# Patient Record
Sex: Male | Born: 1985 | Race: Black or African American | Hispanic: No | Marital: Married | Smoking: Never smoker
Health system: Southern US, Community
[De-identification: ages and names within clinical notes are randomized; demographics above are authoritative.]

---

## 2018-08-25 ENCOUNTER — Ambulatory Visit (HOSPITAL_COMMUNITY)
Admission: EM | Admit: 2018-08-25 | Discharge: 2018-08-25 | Disposition: A | Discharge status: Home / Self Care | Payer: Medicaid Other | Attending: Family Medicine | Admitting: Family Medicine

## 2018-08-25 ENCOUNTER — Encounter (HOSPITAL_COMMUNITY): Payer: Self-pay | Admitting: Family Medicine

## 2018-08-25 DIAGNOSIS — K130 Diseases of lips: Secondary | ICD-10-CM

## 2018-08-25 MED ORDER — DOXYCYCLINE HYCLATE 100 MG PO TABS: 100.0000 mg | ORAL_TABLET | Freq: Two times a day (BID) | ORAL | 0 refills | Status: AC

## 2018-08-25 NOTE — ED Triage Notes (Signed)
Pt presents with cold sore and rash on legs under knee.

## 2018-08-25 NOTE — Discharge Instructions (Addendum)
Please return on Monday night after 6:00 so that I can see how you are doing area

## 2018-08-25 NOTE — ED Provider Notes (Signed)
[facility]    CSN: [id number] Arrival date & time: 08/25/18  1722     History   Chief Complaint Chief Complaint  Patient presents with  . Rash  . Mouth Sore    HPI Joshua Lyons is a 32 y.o. male.   This is a 32 year old man who just returned from [place] and has a swollen upper lip that is become painful.  He is not sure about whether he is run a fever or not.  Patient is quite tired from the travel.     History reviewed. No pertinent past medical history.  There are no active problems to display for this patient.   History reviewed. No pertinent surgical history.     Home Medications    Prior to Admission medications   Medication Sig Start Date End Date Taking? Authorizing Provider  doxycycline (VIBRA-TABS) 100 MG tablet Take 1 tablet (100 mg total) by mouth 2 (two) times daily. 08/25/18   Elvina Sidle, MD    Family History History reviewed. No pertinent family history.  Social History Social History   Tobacco Use  . Smoking status: Not on file  Substance Use Topics  . Alcohol use: Not on file  . Drug use: Not on file     Allergies   Patient has no known allergies.   Review of Systems Review of Systems  Constitutional: Positive for fatigue.  HENT:       Swollen upper lip in the midline  All other systems reviewed and are negative.    Physical Exam Triage Vital Signs ED Triage Vitals  Enc Vitals Group     BP 08/25/18 1751 116/73     Pulse Rate 08/25/18 1751 67     Resp 08/25/18 1751 20     Temp 08/25/18 1751 98 F (36.7 C)     Temp Source 08/25/18 1751 Oral     SpO2 08/25/18 1751 97 %     Weight --      Height --      Head Circumference --      Peak Flow --      Pain Score 08/25/18 1752 4     Pain Loc --      Pain Edu? --      Excl. in GC? --    No data found.  Updated Vital Signs BP 116/73 (BP Location: Left Arm)   Pulse 67   Temp 98 F (36.7 C) (Oral)   Resp 20   SpO2 97%   Physical Exam    Constitutional: He is oriented to person, place, and time. He appears well-developed and well-nourished.  HENT:  Right Ear: External ear normal.  Left Ear: External ear normal.  Eyes: Pupils are equal, round, and reactive to light. Conjunctivae are normal.  Neck: Normal range of motion. Neck supple.  Pulmonary/Chest: Effort normal.  Musculoskeletal: Normal range of motion.  Lymphadenopathy:    He has no cervical adenopathy.  Neurological: He is alert and oriented to person, place, and time.  Skin: Skin is warm and dry.  Fluctuant swelling in the midline of the upper lip, sparing the vermilion border  Nursing note and vitals reviewed.      UC Treatments / Results  Labs (all labs ordered are listed, but only abnormal results are displayed) Labs Reviewed - No data to display  EKG None  Radiology No results found.  Procedures Procedures (including critical care time)  Medications Ordered in UC Medications - No data to display  Initial Impression / Assessment and Plan / UC Course  I have reviewed the triage vital signs and the nursing notes.  Pertinent labs & imaging results that were available during my care of the patient were reviewed by me and considered in my medical decision making (see chart for details).     Final Clinical Impressions(s) / UC Diagnoses   Final diagnoses:  Cellulitis, lip     Discharge Instructions     Please return on Monday night after 6:00 so that I can see how you are doing area    ED Prescriptions    Medication Sig Dispense Auth. Provider   doxycycline (VIBRA-TABS) 100 MG tablet Take 1 tablet (100 mg total) by mouth 2 (two) times daily. 20 tablet Elvina Sidle, MD     Controlled Substance Prescriptions [place] Controlled Substance Registry consulted? Not Applicable   Elvina Sidle, MD 08/25/18 1800

## 2018-09-19 ENCOUNTER — Ambulatory Visit (INDEPENDENT_AMBULATORY_CARE_PROVIDER_SITE_OTHER): Payer: Medicaid Other | Admitting: Family Medicine

## 2018-09-19 ENCOUNTER — Other Ambulatory Visit: Payer: Self-pay

## 2018-09-19 DIAGNOSIS — M222X2 Patellofemoral disorders, left knee: Secondary | ICD-10-CM

## 2018-09-19 DIAGNOSIS — Z0289 Encounter for other administrative examinations: Secondary | ICD-10-CM

## 2018-09-19 NOTE — Patient Instructions (Addendum)
Thank you for coming to see me today. It was a pleasure! Today we talked about:   Your knee pain. Do the knee exercises as discussed.   Please follow-up with me on 10/02/18 or sooner as needed.  If you have any questions or concerns, please do not hesitate to call the office at [phone].  Take Care,   [place] Josefa Syracuse, DO  Patellofemoral Pain Syndrome Patellofemoral pain syndrome is a condition that involves a softening or breakdown of the tissue (cartilage) on the underside of your kneecap (patella). This causes pain in the front of the knee. The condition is also called runner's knee or chondromalacia patella. Patellofemoral pain syndrome is most common in young adults who are active in sports. Your knee is the largest joint in your body. The patella covers the front of your knee and is attached to muscles above and below your knee. The underside of the patella is covered with a smooth type of cartilage (synovium). The smooth surface helps the patella glide easily when you move your knee. Patellofemoral pain syndrome causes swelling in the joint linings and bone surfaces in your knee. What are the causes? Patellofemoral pain syndrome can be caused by:  Overuse.  Poor alignment of your knee joints.  Weak leg muscles.  A direct blow to your kneecap.  What increases the risk? You may be at risk for patellofemoral pain syndrome if you:  Do a lot of activities that can wear down your kneecap. These include: ? Running. ? Squatting. ? Climbing stairs.  Start a new physical activity or exercise program.  Wear shoes that do not fit well.  Do not have good leg strength.  Are overweight.  What are the signs or symptoms? Knee pain is the most common symptom of patellofemoral pain syndrome. This may feel like a dull, aching pain underneath your patella, in the front of your knee. There may be a popping or cracking sound when you move your knee. Pain may get worse  with:  Exercise.  Climbing stairs.  Running.  Jumping.  Squatting.  Kneeling.  Sitting for a long time.  Moving or pushing on your patella.  How is this diagnosed? Your health care provider may be able to diagnose patellofemoral pain syndrome from your symptoms and medical history. You may be asked about your recent physical activities and which ones cause knee pain. Your health care provider may do a physical exam with certain tests to confirm the diagnosis. These may include:  Moving your patella back and forth.  Checking your range of knee motion.  Having you squat or jump to see if you have pain.  Checking the strength of your leg muscles.  An MRI of the knee may also be done. How is this treated? Patellofemoral pain syndrome can usually be treated at home with rest, ice, compression, and elevation (RICE). Other treatments may include:  Nonsteroidal anti-inflammatory drugs (NSAIDs).  Physical therapy to stretch and strengthen your leg muscles.  Shoe inserts (orthotics) to take stress off your knee.  A knee brace or knee support.  Surgery to remove damaged cartilage or move the patella to a better position. The need for surgery is rare.  Follow these instructions at home:  Take medicines only as directed by your health care provider.  Rest your knee. ? When resting, keep your knee raised above the level of your heart. ? Avoid activities that cause knee pain.  Apply ice to the injured area: ? Put ice in a  plastic bag. ? Place a towel between your skin and the bag. ? Leave the ice on for 20 minutes, 2-3 times a day.  Use splints, braces, knee supports, or walking aids as directed by your health care provider.  Perform stretching and strengthening exercises as directed by your health care provider or physical therapist.  Keep all follow-up visits as directed by your health care provider. This is important. Contact a health care provider if:  Your  symptoms get worse.  You are not improving with home care. This information is not intended to replace advice given to you by your health care provider. Make sure you discuss any questions you have with your health care provider. Document Released: 10/13/2009 Document Revised: 04/01/2016 Document Reviewed: 01/14/2014 Elsevier Interactive Patient Education  2018 [employer]

## 2018-09-19 NOTE — Progress Notes (Signed)
Joshua Lyons utilized during today's visit.  [facility]  HPI:  [facility] appointment to establish general primary care, also to discuss L knee pain that occurs when going up and down the stairs. Patient denies trauma, swelling, redness or fevers. Reports the pain is only present when taking the stairs.   ROS:  otherwise see HPI  Past Medical Hx:  -none  Past Surgical Hx:  -none  Family Hx: updated in Epic - Number of family members:  Lives with 4 family members in house - Number of family members in [place]:  7114 family members in [place]  Immigrant Social History: - Name spelling correct?: yes - Date arrived in [place]: 07/11/2018 - Country of origin: [place] - Location of refugee camp (if applicable), how long there, and what caused patient to leave home country?: yes 23 years in [place], left home country due to war - Primary language: Kinyarwanda  -Does not require intepreter (essentially speaks no [place]) - Education: Highest level of education: 14 years, completed high school - Prior work: Health and safety inspectormedical record assistant, interpreter - Best family contact/phone number wife- Astrid DraftsSalua [phone] - Tobacco/alcohol/drug use: never tobacco/drinks 2-3 bottles of beer per week/ never - Marriage Status: yes - Sexual activity: yes - Were you beaten or tortured in your country or refugee camp?  Reports no big trauma but seeing things done to other people  - if yes:  Are you having bad dreams about your experience?   Do you feel "jumpy" or "nervous?" sometimes     Do you feel that the experience is happening again? sometimes     Are you "super alert" or watchful? sometimes  Preventative Care History: -Seen at health department?: yes for vaccinations  PHYSICAL EXAM: BP 100/72   Pulse (!) 58   Temp 98.3 F (36.8 C) (Oral)   Ht 5\' 6"  (1.676 m)   Wt 171 lb 3.2 oz (77.7 kg)   SpO2 97%   BMI 27.63 kg/m  General: NAD, pleasant Eyes: no  conjunctival pallor or injection ENTM: Moist mucous membranes Neck: Supple Respiratory: normal work of breathing Gastrointestinal: soft, nontender, nondistended, normoactive BS MSK: moves 4 extremities equally, L knee with patellar tenderness upon flexion of quad, FROM of L knee, normal gait Derm: no rashes appreciated Psych: AOx3, appropriate affect  A/P: Patient likely with patellofemoral pain syndrome. Instructed on quad stretches and exercises to increase strength and decrease symptoms. Given handout. Patient to return within 1 month.   Examined and interviewed with Dr. Gwendolyn GrantWalden

## 2018-09-20 ENCOUNTER — Encounter: Payer: Self-pay | Admitting: Family Medicine

## 2018-09-20 DIAGNOSIS — Z0289 Encounter for other administrative examinations: Secondary | ICD-10-CM | POA: Insufficient documentation

## 2018-09-20 NOTE — Assessment & Plan Note (Signed)
Doing well with transition to [place] thus far.  No concerns.

## 2018-10-02 ENCOUNTER — Ambulatory Visit: Payer: Medicaid Other | Admitting: Family Medicine

## 2018-10-23 ENCOUNTER — Other Ambulatory Visit: Payer: Self-pay

## 2018-10-23 ENCOUNTER — Encounter: Payer: Self-pay | Admitting: Family Medicine

## 2018-10-23 ENCOUNTER — Ambulatory Visit (INDEPENDENT_AMBULATORY_CARE_PROVIDER_SITE_OTHER): Payer: Medicaid Other | Admitting: Family Medicine

## 2018-10-23 VITALS — BP 112/70 | HR 63 | Temp 98.0°F | Ht 66.0 in | Wt 176.0 lb

## 2018-10-23 DIAGNOSIS — M222X2 Patellofemoral disorders, left knee: Secondary | ICD-10-CM

## 2018-10-23 NOTE — Patient Instructions (Signed)
It was nice meeting you today.  You were seen in clinic for follow-up of your knee pain and for infertility results.  I am glad your knee pain is doing much better now.  I would advise you to continue the home exercises if possible as these will help strengthen and stabilize your knee.  As we discussed, your wife's recent ultrasound was normal.  The doctor will get in touch with her to let her know about this.  Please call clinic if any questions.  Freddrick MarchYashika Sister Carbone MD

## 2018-10-23 NOTE — Progress Notes (Signed)
   Subjective:   Patient ID: Joshua Miyamotoalvin Kozlowski    DOB: 07/10/1986, 32 y.o. male   MRN: [id number]  CC: f/u knee pain, f/u ultrasound result   HPI: Joshua Lyons is a 32 y.o. male who presents to clinic today for the following issues.  Left knee pain Improving overall, is not taking any anti-inflammatories at home for pain management.  He reports he has not been diligent with the home exercises as often as he would like to but tries.  No acute or worsening symptoms.  No numbness, tingling, weakness noted.  Knee pain does not limit his daily activity in any way.  Denies clicking, locking and catching of the joint.   Infertility  Patient reports he and his wife have been trying to have a baby for the past 1.5-2 years.  Wife recently had an ultrasound scan done as part of her infertility testing and he would like to know results.   ROS: No fever, chills, nausea, vomiting.  No numbness, tingling, weakness.   Social: pt is a never smoker.  Medications reviewed. Objective:   BP 112/70   Pulse 63   Temp 98 F (36.7 C) (Oral)   Ht 5\' 6"  (1.676 m)   Wt 176 lb (79.8 kg)   SpO2 97%   BMI 28.41 kg/m  Vitals and nursing note reviewed.  General: 32 yo male, NAD  Neck: supple CV: RRR no MRG  Lungs: CTAB, normal effort  Abdomen: soft, NTND, +bs  Skin: warm, dry, no rash  Left knee: Normal to inspection with no erythema or effusion or obvious bony abnormalities. Palpation normal with no warmth or joint line tenderness.  Mild tenderness over patella with quad flexion.  ROM normal in flexion and extension and lower leg rotation. Ligaments with solid consistent endpoints including ACL, PCL, LCL, MCL. Negative Mcmurray's and provocative meniscal tests. Hamstring and quadriceps strength is normal. Extremities: warm and well perfused  Assessment & Plan:   Patellofemoral pain syndrome of left knee Improving, advised to continue quad strengthening exercises and stretches to help with symptoms.     Follow up as needed.   Infertility  Of note, I have discussed results of ultrasound and further plan with PCP Dr. Talbert ForestShirley and relayed these results to him at OV.  She had attempted to reach wife twice regarding normal results.  Dr. Talbert ForestShirley would recommend continuing to try as both partners have previously been able to have children and are otherwise healthy.  Cost would likely be barrier to extensive infertility workup for them at this time.  He expresses good understanding of this.  Discussed using ovulation calendar and several other techniques that may optimize their chances.   Freddrick MarchYashika Twisha Vanpelt, MD [facility]

## 2018-10-26 NOTE — Assessment & Plan Note (Signed)
Improving, advised to continue quad strengthening exercises and stretches to help with symptoms.   Follow up as needed.

## 2019-01-04 ENCOUNTER — Encounter (HOSPITAL_COMMUNITY): Payer: Self-pay | Admitting: Emergency Medicine

## 2019-01-04 ENCOUNTER — Ambulatory Visit (HOSPITAL_COMMUNITY)
Admission: EM | Admit: 2019-01-04 | Discharge: 2019-01-04 | Disposition: A | Discharge status: Home / Self Care | Payer: Medicaid Other | Attending: Family Medicine | Admitting: Family Medicine

## 2019-01-04 ENCOUNTER — Other Ambulatory Visit: Payer: Self-pay

## 2019-01-04 DIAGNOSIS — R05 Cough: Secondary | ICD-10-CM

## 2019-01-04 DIAGNOSIS — L600 Ingrowing nail: Secondary | ICD-10-CM | POA: Diagnosis not present

## 2019-01-04 DIAGNOSIS — R059 Cough, unspecified: Secondary | ICD-10-CM

## 2019-01-04 MED ORDER — BENZONATATE 100 MG PO CAPS: 100.0000 mg | ORAL_CAPSULE | Freq: Three times a day (TID) | ORAL | 0 refills | Status: DC

## 2019-01-04 MED ORDER — SULFAMETHOXAZOLE-TRIMETHOPRIM 800-160 MG PO TABS: 1.0000 | ORAL_TABLET | Freq: Two times a day (BID) | ORAL | 0 refills | Status: AC

## 2019-01-04 NOTE — ED Triage Notes (Signed)
PT reports cough for 2 weeks. Painful lesions on tongue for 2 days. Painful right great toenail as well.

## 2019-01-04 NOTE — Discharge Instructions (Signed)
Please soak your toe.  Please try to pry up the side of the nail after you have soaked it.  Please try neosporin on it.  Please follow up with your regular doctor to consider cutting the nail

## 2019-01-04 NOTE — ED Provider Notes (Signed)
[facility]    CSN: [id number] Arrival date & time: 01/04/19  1722     History   Chief Complaint Chief Complaint  Patient presents with  . Cough  . Mouth Lesions    HPI Joshua Lyons is a 33 y.o. male.   He is presenting with cough and right great toe pain.  His cough has been present for a few days.  He has tried over-the-counter medications with no relief.  Denies any fevers or chills.  Denies any wheezing.  Feels like the cough is worse at night.  Also having lateral sided great toe pain.  Having some discharge from this area.  Pain is worse with touch or walking.  No trauma or inciting event.  No history of similar symptoms.  Has not tried anything for the toe.  Pain is severe and localized to the toe.  HPI  History reviewed. No pertinent past medical history.  Patient Active Problem List   Diagnosis Date Noted  . Refugee health examination 09/20/2018  . Patellofemoral pain syndrome of left knee 09/19/2018    History reviewed. No pertinent surgical history.     Home Medications    Prior to Admission medications   Medication Sig Start Date End Date Taking? Authorizing Provider  benzonatate (TESSALON) 100 MG capsule Take 1 capsule (100 mg total) by mouth every 8 (eight) hours. 01/04/19   Myra Rude, MD  doxycycline (VIBRA-TABS) 100 MG tablet Take 1 tablet (100 mg total) by mouth 2 (two) times daily. 08/25/18   Elvina Sidle, MD  sulfamethoxazole-trimethoprim (BACTRIM DS,SEPTRA DS) 800-160 MG tablet Take 1 tablet by mouth 2 (two) times daily. 01/04/19   Myra Rude, MD    Family History No family history on file.  Social History Social History   Tobacco Use  . Smoking status: Never Smoker  . Smokeless tobacco: Never Used  Substance Use Topics  . Alcohol use: Not on file  . Drug use: Not on file     Allergies   Patient has no known allergies.   Review of Systems Review of Systems  Constitutional: Negative for fever.  HENT:  Negative for congestion.   Respiratory: Positive for cough.   Cardiovascular: Negative for chest pain.  Gastrointestinal: Negative for abdominal distention.  Musculoskeletal: Positive for gait problem.  Skin: Positive for color change.  Neurological: Negative for weakness.  Hematological: Negative for adenopathy.  Psychiatric/Behavioral: Negative for agitation.     Physical Exam Triage Vital Signs ED Triage Vitals [01/04/19 1838]  Enc Vitals Group     BP 132/80     Pulse Rate (!) 59     Resp 16     Temp 97.9 F (36.6 C)     Temp Source Oral     SpO2 96 %     Weight      Height      Head Circumference      Peak Flow      Pain Score 4     Pain Loc      Pain Edu?      Excl. in GC?    No data found.  Updated Vital Signs BP 132/80   Pulse (!) 59   Temp 97.9 F (36.6 C) (Oral)   Resp 16   SpO2 96%   Visual Acuity Right Eye Distance:   Left Eye Distance:   Bilateral Distance:    Right Eye Near:   Left Eye Near:    Bilateral Near:  Physical Exam Gen: NAD, alert, cooperative with exam, well-appearing ENT: normal lips, normal nasal mucosa, tympanic membranes clear and intact bilaterally, normal oropharynx, no cervical lymphadenopathy Eye: normal EOM, normal conjunctiva and lids CV:  no edema, +2 pedal pulses, regular rhythm, S1-S2   Resp: no accessory muscle use, non-labored, clear to auscultation bilaterally, no crackles or wheezes Skin: no rashes, no areas of induration  Neuro: normal tone, normal sensation to touch Psych:  normal insight, alert and oriented MSK: discharge expressed from the lateral nail bed of right great toe. No fluctuance appreciated.  Tenderness to palpation over this area.  No streaking.  Neurovascular intact   UC Treatments / Results  Labs (all labs ordered are listed, but only abnormal results are displayed) Labs Reviewed - No data to display  EKG None  Radiology No results found.  Procedures Procedures (including critical  care time)  Medications Ordered in UC Medications - No data to display  Initial Impression / Assessment and Plan / UC Course  I have reviewed the triage vital signs and the nursing notes.  Pertinent labs & imaging results that were available during my care of the patient were reviewed by me and considered in my medical decision making (see chart for details).     Joshua Lyons is a 33 year old male is presenting with cough and right sided great toe ingrown toenail.  Cough seems to be viral in nature.  Not having any wheezing or crackles on exam.  His right great toe has ingrown on the lateral aspect.  Having some expression of discharge from this area.  Does not appear to be a paronychia.  Provided Bactrim and counseled on supportive care.  Advised to follow-up for consideration of wedge resection.  Final Clinical Impressions(s) / UC Diagnoses   Final diagnoses:  Cough  Ingrown toenail     Discharge Instructions     Please soak your toe.  Please try to pry up the side of the nail after you have soaked it.  Please try neosporin on it.  Please follow up with your regular doctor to consider cutting the nail      ED Prescriptions    Medication Sig Dispense Auth. Provider   benzonatate (TESSALON) 100 MG capsule Take 1 capsule (100 mg total) by mouth every 8 (eight) hours. 21 capsule Myra Rude, MD   sulfamethoxazole-trimethoprim (BACTRIM DS,SEPTRA DS) 800-160 MG tablet Take 1 tablet by mouth 2 (two) times daily. 14 tablet Myra Rude, MD     Controlled Substance Prescriptions [place] Controlled Substance Registry consulted? Not Applicable   Myra Rude, MD 01/04/19 1919

## 2019-01-23 ENCOUNTER — Emergency Department (HOSPITAL_COMMUNITY)
Admission: EM | Admit: 2019-01-23 | Discharge: 2019-01-23 | Disposition: A | Discharge status: Home / Self Care | Payer: Medicaid Other | Attending: Emergency Medicine | Admitting: Emergency Medicine

## 2019-01-23 ENCOUNTER — Emergency Department (HOSPITAL_COMMUNITY): Payer: Medicaid Other

## 2019-01-23 ENCOUNTER — Encounter (HOSPITAL_COMMUNITY): Payer: Self-pay | Admitting: Emergency Medicine

## 2019-01-23 DIAGNOSIS — L600 Ingrowing nail: Secondary | ICD-10-CM | POA: Insufficient documentation

## 2019-01-23 DIAGNOSIS — R05 Cough: Secondary | ICD-10-CM | POA: Insufficient documentation

## 2019-01-23 DIAGNOSIS — R059 Cough, unspecified: Secondary | ICD-10-CM

## 2019-01-23 MED ORDER — BENZONATATE 100 MG PO CAPS: 100.0000 mg | ORAL_CAPSULE | Freq: Three times a day (TID) | ORAL | 0 refills | Status: AC

## 2019-01-23 MED ORDER — GUAIFENESIN 100 MG/5ML PO LIQD: 100.0000 mg | ORAL | 0 refills | Status: AC | PRN

## 2019-01-23 MED ORDER — CEPHALEXIN 500 MG PO CAPS: 500.0000 mg | ORAL_CAPSULE | Freq: Four times a day (QID) | ORAL | 0 refills | Status: AC

## 2019-01-23 NOTE — Discharge Instructions (Addendum)
You were evaluated today for cough.  This is likely a post viral cough.  I given you Tessalon Perles as well as cough medicine.  Please take as prescribed.  Follow-up with PCP for reevaluation for continued symptoms beyond 3 to 4 days.  Return to the ED for any new or worsening symptoms.

## 2019-01-23 NOTE — ED Provider Notes (Signed)
[facility] EMERGENCY DEPARTMENT Provider Note   CSN: [id number] Arrival date & time: 01/23/19  1118  History   Chief Complaint Chief Complaint  Patient presents with   Influenza   HPI Joshua Lyons is a 33 y.o. male with no significant past medical history who presents for evaluation of cough.  Patient states he was diagnosed with influenza approximately 9-10 days ago.  Was given Tamiflu at that time with resolved majority of symptoms.  Patient states he is continued to have productive cough since his flu diagnosis.  Cough productive of clear sputum.  Denies fever, chills, nausea, vomiting, headache, sore throat, congestion, rhinorrhea, chest pain, shortness of breath, abdominal pain, diarrhea dysuria.  No recent travel or exposure to known coronavirus positive patients.  Has not taken anything for symptoms PTA.  Denies additional aggravating or alleviating factors.  No known pulmonary disorders. Denies chronic medication use. No hemoptysis, night sweats, known exposure to TB patients.   At DC patient with complaints to right great toe pain. States he was seen by UC 3 weeks ago and given Abx for ingrown toe nail. States this area is no longer draining or red however continues to have pain to lateral nail bed. Has been using "wood tooth picks" to pick at the area. Was previously using warm water bath however has not used this in over one week.  History obtained from patient.  Patient denies medical interpreter.    HPI  History reviewed. No pertinent past medical history.  Patient Active Problem List   Diagnosis Date Noted   Refugee health examination 09/20/2018   Patellofemoral pain syndrome of left knee 09/19/2018   History reviewed. No pertinent surgical history.   Home Medications    Prior to Admission medications   Medication Sig Start Date End Date Taking? Authorizing Provider  benzonatate (TESSALON) 100 MG capsule Take 1 capsule (100 mg total) by mouth  3 (three) times daily. 01/23/19   Ricquel Foulk A, PA-C  cephALEXin (KEFLEX) 500 MG capsule Take 1 capsule (500 mg total) by mouth 4 (four) times daily. 01/23/19   Thedora Rings A, PA-C  doxycycline (VIBRA-TABS) 100 MG tablet Take 1 tablet (100 mg total) by mouth 2 (two) times daily. 08/25/18   Elvina Sidle, MD  guaiFENesin (ROBITUSSIN) 100 MG/5ML liquid Take 5-10 mLs (100-200 mg total) by mouth every 4 (four) hours as needed for cough. 01/23/19   Chrisangel Eskenazi A, PA-C  sulfamethoxazole-trimethoprim (BACTRIM DS,SEPTRA DS) 800-160 MG tablet Take 1 tablet by mouth 2 (two) times daily. 01/04/19   Myra Rude, MD    Family History History reviewed. No pertinent family history.  Social History Social History   Tobacco Use   Smoking status: Never Smoker   Smokeless tobacco: Never Used  Substance Use Topics   Alcohol use: Not on file   Drug use: Not on file    Allergies   Patient has no known allergies.  Review of Systems Review of Systems  Constitutional: Negative.   HENT: Negative.   Respiratory: Positive for cough. Negative for apnea, choking, chest tightness, shortness of breath, wheezing and stridor.   Cardiovascular: Negative.   Gastrointestinal: Negative.   Genitourinary: Negative.   Musculoskeletal: Negative.   Skin: Negative.   Neurological: Negative.   All other systems reviewed and are negative.  Physical Exam Updated Vital Signs BP 131/89 (BP Location: Right Arm)    Pulse (!) 58    Temp 98.2 F (36.8 C) (Oral)    Resp 16  SpO2 100%   Physical Exam Vitals signs and nursing note reviewed.  Constitutional:      General: He is not in acute distress.    Appearance: He is not ill-appearing, toxic-appearing or diaphoretic.  HENT:     Head: Normocephalic and atraumatic.     Jaw: There is normal jaw occlusion.     Right Ear: Tympanic membrane, ear canal and external ear normal. There is no impacted cerumen. No hemotympanum. Tympanic membrane is not  injected, scarred, perforated, erythematous, retracted or bulging.     Left Ear: Tympanic membrane, ear canal and external ear normal. There is no impacted cerumen. No hemotympanum. Tympanic membrane is not injected, scarred, perforated, erythematous, retracted or bulging.     Ears:     Comments: No Mastoid tenderness.    Nose:     Comments: Clear rhinorrhea and congestion to bilateral nares.  No sinus tenderness.    Mouth/Throat:     Comments: Posterior oropharynx clear.  Mucous membranes moist.  Tonsils without erythema or exudate.  Uvula midline without deviation.  No evidence of PTA or RPA.  No drooling, dysphasia or trismus.  Phonation normal. Neck:     Trachea: Trachea and phonation normal.     Meningeal: Brudzinski's sign and Kernig's sign absent.     Comments: No Neck stiffness or neck rigidity.  No meningismus.  No cervical lymphadenopathy. Cardiovascular:     Comments: No murmurs rubs or gallops. Pulmonary:     Comments: Clear to auscultation bilaterally without wheeze, rhonchi or rales.  No accessory muscle usage.  Able speak in full sentences. Abdominal:     Comments: Soft, nontender without rebound or guarding.  No CVA tenderness.  Musculoskeletal:     Comments: Moves all 4 extremities without difficulty. Lower extremities without edema, erythema or warmth. Tenderness to lateral surface to right great toe. No erythema, fluctuance, induration or warmth. No bony tenderness Mild dried blood to bilateral nail folds on right great toe. Patient has been stick toe picks into surface of nail bed. No active drainage or bleeding.  Skin:    Comments: Brisk capillary refill.  No rashes or lesions.  Neurological:     Mental Status: He is alert.     Comments: Ambulatory in department without difficulty.  Cranial nerves II through XII grossly intact.  No facial droop.  No aphasia.     ED Treatments / Results  Labs (all labs ordered are listed, but only abnormal results are  displayed) Labs Reviewed - No data to display  EKG None  Radiology Dg Chest 2 View  Result Date: 01/23/2019 CLINICAL DATA:  Cough with headache EXAM: CHEST - 2 VIEW COMPARISON:  None. FINDINGS: Lungs are clear. Heart size and pulmonary vascularity are normal. No adenopathy. No bone lesions. IMPRESSION: No edema or consolidation. Electronically Signed   By: Bretta Bang III M.D.   On: 01/23/2019 12:06    Procedures Procedures (including critical care time)  Medications Ordered in ED Medications - No data to display  Initial Impression / Assessment and Plan / ED Course  I have reviewed the triage vital signs and the nursing notes.  Pertinent labs & imaging results that were available during my care of the patient were reviewed by me and considered in my medical decision making (see chart for details).  1145: Patient not in room for initial evaluation  8264: 33 year old male appears otherwise well presents for evaluation of cough.  Diagnosed with influenza 10 days ago.  Majority symptoms resolved  with Tamiflu, however has continued to have productive cough of clear sputum.  Clear to auscultation bilateral without wheeze, rhonchi or rales.  No tachypnea, tachycardia or hypoxia.  Able speak in full sentences at difficulty.   Personally Reviewed chest x-ray-chest x-ray negative for infiltrates, pulmonary edema, cardiomegaly or pneumothorax.    Ears without evidence of otitis.  Posterior oropharynx clear.  No evidence of PTA or RPA.  No neck stiffness or neck rigidity, low suspicion for meningitis.  Patient likely with post viral cough. No Sx consistent with TB, medication rx cough, PE. Will DC home with cough syrup as well as Tessalon Perles.  Patient hemodynamically stable and appropriate for DC home at this time.  Patient has not had any recent travel as well as no recent exposure to coronavirus positive patients.  At this time patient does not meet CDC criteria for testing for  coronavirus.  Discussed with patient strict return precautions.  Patient voiced understanding and is agreeable to follow-up.  DDX: Influenza, URI, pneumonia, coronavirus, PE, TB, medication reaction, seasonal allergies  At dc patient with complaints to right great toe to nursing staff.  Given doxycycline by urgent care 3 weeks ago with significant resolve of symptoms.  Patient states area is no longer erythematous, warm or draining. Pain returned after wearing closed toe shoes the last 2 days.  Patient with mild tenderness to lateral surface of right great toe. No swelling, fluctuance, erythema, warmth. No Bony tenderness.  Normal musculoskeletal exam.  Neurovascularly intact.  Discussed with patient possible wedge resection in ED versus follow-up with podiatry.  Patient prefers to follow up with podiatry.  Will DC home with Keflex, discussed warm soaks.  Low suspicion for bony changes. Discussed xray to assess for osteomyelitis however does not have risk factors. Patient declines and wishes to FU with Podiatry.  Low suspicion for gout, hemarthrosis, septic joint.     Final Clinical Impressions(s) / ED Diagnoses   Final diagnoses:  Cough  Ingrown toenail of right foot    ED Discharge Orders         Ordered    benzonatate (TESSALON) 100 MG capsule  3 times daily     01/23/19 1229    guaiFENesin (ROBITUSSIN) 100 MG/5ML liquid  Every 4 hours PRN     01/23/19 1229    cephALEXin (KEFLEX) 500 MG capsule  4 times daily     01/23/19 1301           Katisha Shimizu A, PA-C 01/23/19 1314    Charlynne Pander, MD 01/23/19 1758

## 2019-01-23 NOTE — ED Triage Notes (Addendum)
Pt having symptoms for week and half. States took meds but cant recall what. States been in UC and took antibiotics- unsure if about story due to language barrier.

## 2019-02-14 ENCOUNTER — Encounter (HOSPITAL_COMMUNITY): Payer: Self-pay

## 2019-02-14 ENCOUNTER — Emergency Department (HOSPITAL_COMMUNITY)
Admission: EM | Admit: 2019-02-14 | Discharge: 2019-02-14 | Disposition: A | Discharge status: Home / Self Care | Payer: Medicaid Other | Attending: Emergency Medicine | Admitting: Emergency Medicine

## 2019-02-14 ENCOUNTER — Other Ambulatory Visit: Payer: Self-pay

## 2019-02-14 DIAGNOSIS — J069 Acute upper respiratory infection, unspecified: Secondary | ICD-10-CM | POA: Insufficient documentation

## 2019-02-14 DIAGNOSIS — Z79899 Other long term (current) drug therapy: Secondary | ICD-10-CM | POA: Diagnosis not present

## 2019-02-14 DIAGNOSIS — R05 Cough: Secondary | ICD-10-CM | POA: Diagnosis present

## 2019-02-14 NOTE — Discharge Instructions (Signed)
You are presumed to have an upper respiratory infection.  Given his current pandemic, there is concern he could have COVID-19.  He need to quarantine yourself for 7 days and be symptom-free for at least 3 days before he can come out of quarantine.  If at any point you develop shortness of breath then you need to return to the hospital for evaluation.

## 2019-02-14 NOTE — ED Provider Notes (Signed)
[facility] EMERGENCY DEPARTMENT Provider Note   CSN: [id number] Arrival date & time: 02/14/19  1305    History   Chief Complaint Chief Complaint  Joshua Lyons presents with  . Cough  . Headache    HPI Joshua Lyons is a 33 y.o. male.     HPI  33 year old male presents with cough, mild headache and rhinorrhea.  Started yesterday.  Joshua Lyons does not feel short of breath, does not have chest pain, and no fever.  Joshua Lyons wife is sick with similar symptoms but a little more severe with a fever.  Otherwise is feeling okay and denies any body aches.  History reviewed. No pertinent past medical history.  Joshua Lyons Active Problem List   Diagnosis Date Noted  . Refugee health examination 09/20/2018  . Patellofemoral pain syndrome of left knee 09/19/2018    History reviewed. No pertinent surgical history.      Home Medications    Prior to Admission medications   Medication Sig Start Date End Date Taking? Authorizing Provider  benzonatate (TESSALON) 100 MG capsule Take 1 capsule (100 mg total) by mouth 3 (three) times daily. 01/23/19   Henderly, Britni A, PA-C  cephALEXin (KEFLEX) 500 MG capsule Take 1 capsule (500 mg total) by mouth 4 (four) times daily. 01/23/19   Henderly, Britni A, PA-C  doxycycline (VIBRA-TABS) 100 MG tablet Take 1 tablet (100 mg total) by mouth 2 (two) times daily. 08/25/18   Elvina Sidle, MD  guaiFENesin (ROBITUSSIN) 100 MG/5ML liquid Take 5-10 mLs (100-200 mg total) by mouth every 4 (four) hours as needed for cough. 01/23/19   Henderly, Britni A, PA-C  sulfamethoxazole-trimethoprim (BACTRIM DS,SEPTRA DS) 800-160 MG tablet Take 1 tablet by mouth 2 (two) times daily. 01/04/19   Myra Rude, MD    Family History History reviewed. No pertinent family history.  Social History Social History   Tobacco Use  . Smoking status: Never Smoker  . Smokeless tobacco: Never Used  Substance Use Topics  . Alcohol use: Not on file  . Drug use: Not on file      Allergies   Joshua Lyons has no known allergies.   Review of Systems Review of Systems  Constitutional: Negative for fever.  HENT: Positive for congestion and rhinorrhea. Negative for sore throat.   Respiratory: Positive for cough. Negative for shortness of breath.   Cardiovascular: Negative for chest pain.  Neurological: Positive for headaches.     Physical Exam Updated Vital Signs BP (!) 126/91 (BP Location: Right Arm) Comment: Simultaneous filing. User may not have seen previous data.  Pulse 88 Comment: Simultaneous filing. User may not have seen previous data.  Temp 98.2 F (36.8 C) (Oral)   Resp 16   SpO2 99% Comment: Simultaneous filing. User may not have seen previous data.  Physical Exam Vitals signs and nursing note reviewed.  Constitutional:      General: Joshua Lyons is not in acute distress.    Appearance: Joshua Lyons is well-developed. Joshua Lyons is not ill-appearing or diaphoretic.  HENT:     Head: Normocephalic and atraumatic.     Right Ear: External ear normal.     Left Ear: External ear normal.     Nose: Nose normal.  Eyes:     General:        Right eye: No discharge.        Left eye: No discharge.  Neck:     Musculoskeletal: Neck supple.  Cardiovascular:     Rate and Rhythm: Normal rate and regular  rhythm.     Heart sounds: Normal heart sounds.  Pulmonary:     Effort: Pulmonary effort is normal.     Breath sounds: Normal breath sounds.  Abdominal:     Palpations: Abdomen is soft.     Tenderness: There is no abdominal tenderness.  Skin:    General: Skin is warm and dry.  Neurological:     Mental Status: Joshua Lyons is alert.  Psychiatric:        Mood and Affect: Mood is not anxious.      ED Treatments / Results  Labs (all labs ordered are listed, but only abnormal results are displayed) Labs Reviewed - No data to display  EKG None  Radiology No results found.  Procedures Procedures (including critical care time)  Medications Ordered in ED Medications - No data  to display   Initial Impression / Assessment and Plan / ED Course  I have reviewed the triage vital signs and the nursing notes.  Pertinent labs & imaging results that were available during my care of the Joshua Lyons were reviewed by me and considered in my medical decision making (see chart for details).        Joshua Lyons is well-appearing here.  Appears to have a mild viral URI.  However given the current pandemic this could be COVID-19 and Joshua Lyons is being asked to self quarantine. Given clear lungs, no increased work of breathing or hypoxia, my suspicion for pneumonia is quite low. D/c home with return precautions.  Joshua Lyons was evaluated in Emergency Department on 02/14/2019 for the symptoms described in the history of present illness. Joshua Lyons was evaluated in the context of the global COVID-19 pandemic, which necessitated consideration that the Joshua Lyons might be at risk for infection with the SARS-CoV-2 virus that causes COVID-19. Institutional protocols and algorithms that pertain to the evaluation of patients at risk for COVID-19 are in a state of rapid change based on information released by regulatory bodies including the CDC and federal and state organizations. These policies and algorithms were followed during the Joshua Lyons's care in the ED.   Final Clinical Impressions(s) / ED Diagnoses   Final diagnoses:  Viral upper respiratory tract infection    ED Discharge Orders    None       Pricilla LovelessGoldston, Nehemias Sauceda, MD 02/14/19 1413

## 2019-02-14 NOTE — ED Triage Notes (Signed)
Pt reports a cough beginning yesterday with white phlegm and a headache beginning this morning.

## 2019-02-14 NOTE — ED Notes (Signed)
Patient verbalizes understanding of discharge instructions. Opportunity for questioning and answers were provided. Armband removed by staff, pt discharged from ED.  

## 2019-02-19 ENCOUNTER — Encounter (HOSPITAL_COMMUNITY): Payer: Self-pay

## 2019-02-19 ENCOUNTER — Telehealth (HOSPITAL_COMMUNITY): Payer: Self-pay

## 2019-02-19 ENCOUNTER — Emergency Department (HOSPITAL_COMMUNITY)
Admission: EM | Admit: 2019-02-19 | Discharge: 2019-02-19 | Disposition: A | Discharge status: Home / Self Care | Payer: Medicaid Other | Attending: Emergency Medicine | Admitting: Emergency Medicine

## 2019-02-19 ENCOUNTER — Emergency Department (HOSPITAL_COMMUNITY): Payer: Medicaid Other

## 2019-02-19 DIAGNOSIS — Z20828 Contact with and (suspected) exposure to other viral communicable diseases: Secondary | ICD-10-CM | POA: Diagnosis not present

## 2019-02-19 DIAGNOSIS — R6889 Other general symptoms and signs: Secondary | ICD-10-CM

## 2019-02-19 DIAGNOSIS — Z79899 Other long term (current) drug therapy: Secondary | ICD-10-CM | POA: Diagnosis not present

## 2019-02-19 DIAGNOSIS — Z20822 Contact with and (suspected) exposure to covid-19: Secondary | ICD-10-CM

## 2019-02-19 DIAGNOSIS — R0682 Tachypnea, not elsewhere classified: Secondary | ICD-10-CM | POA: Insufficient documentation

## 2019-02-19 DIAGNOSIS — E876 Hypokalemia: Secondary | ICD-10-CM | POA: Diagnosis not present

## 2019-02-19 DIAGNOSIS — R05 Cough: Secondary | ICD-10-CM | POA: Diagnosis present

## 2019-02-19 LAB — RESPIRATORY PANEL BY PCR

## 2019-02-19 LAB — CBC WITH DIFFERENTIAL/PLATELET
Abs Immature Granulocytes: 0.02 10*3/uL (ref 0.00–0.07)
Basophils Absolute: 0 10*3/uL (ref 0.0–0.1)
Basophils Relative: 0 %
Eosinophils Absolute: 0.1 10*3/uL (ref 0.0–0.5)
Eosinophils Relative: 1 %
HCT: 42.9 % (ref 39.0–52.0)
Hemoglobin: 14.1 g/dL (ref 13.0–17.0)
Immature Granulocytes: 0 %
Lymphocytes Relative: 28 %
Lymphs Abs: 1.4 10*3/uL (ref 0.7–4.0)
MCH: 28.5 pg (ref 26.0–34.0)
MCHC: 32.9 g/dL (ref 30.0–36.0)
MCV: 86.7 fL (ref 80.0–100.0)
Monocytes Absolute: 0.5 10*3/uL (ref 0.1–1.0)
Monocytes Relative: 10 %
Neutro Abs: 2.9 10*3/uL (ref 1.7–7.7)
Neutrophils Relative %: 61 %
Platelets: 170 10*3/uL (ref 150–400)
RBC: 4.95 MIL/uL (ref 4.22–5.81)
RDW: 12.7 % (ref 11.5–15.5)
WBC: 4.9 10*3/uL (ref 4.0–10.5)
nRBC: 0 % (ref 0.0–0.2)

## 2019-02-19 LAB — COMPREHENSIVE METABOLIC PANEL
ALT: 26 U/L (ref 0–44)
AST: 31 U/L (ref 15–41)
Albumin: 4 g/dL (ref 3.5–5.0)
Alkaline Phosphatase: 54 U/L (ref 38–126)
Anion gap: 13 (ref 5–15)
BUN: 10 mg/dL (ref 6–20)
CO2: 23 mmol/L (ref 22–32)
Calcium: 9 mg/dL (ref 8.9–10.3)
Chloride: 101 mmol/L (ref 98–111)
Creatinine, Ser: 0.96 mg/dL (ref 0.61–1.24)
GFR calc Af Amer: >60 mL/min (ref >60)
GFR calc non Af Amer: >60 mL/min (ref >60)
Glucose, Bld: 106 mg/dL — ABNORMAL HIGH (ref 70–99)
Potassium: 3.3 mmol/L — ABNORMAL LOW (ref 3.5–5.1)
Sodium: 137 mmol/L (ref 135–145)
Total Bilirubin: 0.7 mg/dL (ref 0.3–1.2)
Total Protein: 6.9 g/dL (ref 6.5–8.1)

## 2019-02-19 LAB — SARS CORONAVIRUS 2 BY RT PCR (HOSPITAL ORDER, PERFORMED IN ~~LOC~~ HOSPITAL LAB): SARS Coronavirus 2: POSITIVE — AB

## 2019-02-19 LAB — LACTIC ACID, PLASMA
Lactic Acid, Venous: 1.1 mmol/L (ref 0.5–1.9)
Lactic Acid, Venous: 0.8 mmol/L (ref 0.5–1.9)

## 2019-02-19 MED ORDER — POTASSIUM CHLORIDE CRYS ER 20 MEQ PO TBCR
40.0000 meq | EXTENDED_RELEASE_TABLET | Freq: Once | ORAL | Status: AC
  Administered 2019-02-19: 40 meq via ORAL
  Filled 2019-02-19: qty 2

## 2019-02-19 MED ORDER — SODIUM CHLORIDE 0.9 % IV BOLUS
500.0000 mL | Freq: Once | INTRAVENOUS | Status: AC
  Administered 2019-02-19: 05:00:00 500 mL via INTRAVENOUS

## 2019-02-19 MED ORDER — LEVOFLOXACIN 750 MG PO TABS: 750.0000 mg | ORAL_TABLET | Freq: Every day | ORAL | 0 refills | Status: AC

## 2019-02-19 MED ORDER — POTASSIUM CHLORIDE CRYS ER 20 MEQ PO TBCR: 20.0000 meq | EXTENDED_RELEASE_TABLET | Freq: Every day | ORAL | 0 refills | Status: AC

## 2019-02-19 MED ORDER — LEVOFLOXACIN 750 MG PO TABS
750.0000 mg | ORAL_TABLET | Freq: Once | ORAL | Status: AC
  Administered 2019-02-19: 08:00:00 750 mg via ORAL
  Filled 2019-02-19: qty 1

## 2019-02-19 MED ORDER — ACETAMINOPHEN 500 MG PO TABS
1000.0000 mg | ORAL_TABLET | Freq: Once | ORAL | Status: AC
  Administered 2019-02-19: 1000 mg via ORAL
  Filled 2019-02-19: qty 2

## 2019-02-19 NOTE — ED Provider Notes (Signed)
[facility] EMERGENCY DEPARTMENT Provider Note   CSN: [id number] Arrival date & time: 02/19/19  0458    History   Chief Complaint Chief Complaint  Patient presents with   Cough    HPI Joshua Lyons is a 33 y.o. male with a hx of no major medical problems presents to the Emergency Department complaining of gradual, persistent, progressively worsening cough and fever onset February 13, 2019.  Patient reports he has associated headache.  He states that he had some abdominal pain several days ago but this resolved.  Patient reports that his cough is dry and he has had several episodes of posttussive emesis.  He reports he does feel short of breath but denies chest pain.  Patient's wife is sick with similar symptoms.  He has no other known sick contacts.  No recent travel.  Patient reports taking Tylenol at home but states this has not helped his fever.  No aggravating symptoms.  Patient is a non-smoker.  No previous respiratory problems.  No known COVID sick contacts.     The history is provided by the patient and medical records.    History reviewed. No pertinent past medical history.  Patient Active Problem List   Diagnosis Date Noted   Refugee health examination 09/20/2018   Patellofemoral pain syndrome of left knee 09/19/2018    History reviewed. No pertinent surgical history.      Home Medications    Prior to Admission medications   Medication Sig Start Date End Date Taking? Authorizing Provider  benzonatate (TESSALON) 100 MG capsule Take 1 capsule (100 mg total) by mouth 3 (three) times daily. 01/23/19   Henderly, Britni A, PA-C  cephALEXin (KEFLEX) 500 MG capsule Take 1 capsule (500 mg total) by mouth 4 (four) times daily. 01/23/19   Henderly, Britni A, PA-C  doxycycline (VIBRA-TABS) 100 MG tablet Take 1 tablet (100 mg total) by mouth 2 (two) times daily. 08/25/18   Elvina Sidle, MD  guaiFENesin (ROBITUSSIN) 100 MG/5ML liquid Take 5-10 mLs (100-200 mg  total) by mouth every 4 (four) hours as needed for cough. 01/23/19   Henderly, Britni A, PA-C  levofloxacin (LEVAQUIN) 750 MG tablet Take 1 tablet (750 mg total) by mouth daily. X 7 days 02/19/19   Cadynce Garrette, Dahlia Client, PA-C  potassium chloride SA (K-DUR,KLOR-CON) 20 MEQ tablet Take 1 tablet (20 mEq total) by mouth daily. 02/19/19   Kayleann Mccaffery, Dahlia Client, PA-C  sulfamethoxazole-trimethoprim (BACTRIM DS,SEPTRA DS) 800-160 MG tablet Take 1 tablet by mouth 2 (two) times daily. 01/04/19   Myra Rude, MD    Family History No family history on file.  Social History Social History   Tobacco Use   Smoking status: Never Smoker   Smokeless tobacco: Never Used  Substance Use Topics   Alcohol use: Not on file   Drug use: Not on file     Allergies   Patient has no known allergies.   Review of Systems Review of Systems  Constitutional: Positive for fatigue and fever. Negative for appetite change, diaphoresis and unexpected weight change.  HENT: Negative for mouth sores.   Eyes: Negative for visual disturbance.  Respiratory: Positive for cough, chest tightness and shortness of breath. Negative for wheezing.   Cardiovascular: Negative for chest pain.  Gastrointestinal: Positive for abdominal pain ( resolved) and vomiting ( post-tussive). Negative for constipation, diarrhea and nausea.  Endocrine: Negative for polydipsia, polyphagia and polyuria.  Genitourinary: Negative for dysuria, frequency, hematuria and urgency.  Musculoskeletal: Negative for back pain and neck  stiffness.  Skin: Negative for rash.  Allergic/Immunologic: Negative for immunocompromised state.  Neurological: Positive for headaches. Negative for syncope and light-headedness.  Hematological: Does not bruise/bleed easily.  Psychiatric/Behavioral: Negative for sleep disturbance. The patient is not nervous/anxious.      Physical Exam Updated Vital Signs BP 133/77    Pulse 100    Temp (!) 102.8 F (39.3 C) (Oral)     Resp (!) 23    SpO2 96%   Physical Exam Vitals signs and nursing note reviewed.  Constitutional:      General: He is not in acute distress.    Appearance: He is not diaphoretic.  HENT:     Head: Normocephalic.     Nose: Nose normal.  Eyes:     General: No scleral icterus.    Conjunctiva/sclera: Conjunctivae normal.  Neck:     Musculoskeletal: Normal range of motion. No neck rigidity.  Cardiovascular:     Rate and Rhythm: Regular rhythm. Tachycardia present.     Pulses: Normal pulses.          Radial pulses are 2+ on the right side and 2+ on the left side.  Pulmonary:     Effort: Tachypnea present. No prolonged expiration, respiratory distress or retractions.     Comments: Equal chest rise. No increased work of breathing. Abdominal:     General: There is no distension.     Palpations: Abdomen is soft.     Tenderness: There is no abdominal tenderness. There is no guarding or rebound.  Musculoskeletal:     Comments: Moves all extremities equally and without difficulty.  Skin:    General: Skin is warm and dry.     Capillary Refill: Capillary refill takes less than 2 seconds.     Comments: Hot to touch  Neurological:     Mental Status: He is alert.     GCS: GCS eye subscore is 4. GCS verbal subscore is 5. GCS motor subscore is 6.     Comments: Speech is clear and goal oriented.  Psychiatric:        Mood and Affect: Mood normal.      ED Treatments / Results  Labs (all labs ordered are listed, but only abnormal results are displayed) Labs Reviewed  COMPREHENSIVE METABOLIC PANEL - Abnormal; Notable for the following components:      Result Value   Potassium 3.3 (*)    Glucose, Bld 106 (*)    All other components within normal limits  CULTURE, BLOOD (ROUTINE X 2)  CULTURE, BLOOD (ROUTINE X 2)  RESPIRATORY PANEL BY PCR  NOVEL CORONAVIRUS, NAA (HOSPITAL ORDER, SEND-OUT TO REF LAB)  CBC WITH DIFFERENTIAL/PLATELET  LACTIC ACID, PLASMA  LACTIC ACID, PLASMA      Radiology Dg Chest Port 1 View  Result Date: 02/19/2019 CLINICAL DATA:  Cough and fever. EXAM: PORTABLE CHEST 1 VIEW COMPARISON:  Two-view chest x-ray 01/23/2019 FINDINGS: The heart size is exaggerated by low lung volumes. Mild bibasilar airspace disease is new. Upper lung fields are clear. The visualized soft tissues and bony thorax are unremarkable. IMPRESSION: 1. Low lung volumes with new bibasilar airspace disease, left greater than right. While this may represent atelectasis, infection is not excluded. Electronically Signed   By: Marin Robertshristopher  Mattern M.D.   On: 02/19/2019 05:29    Procedures Procedures (including critical care time)  Medications Ordered in ED Medications  levofloxacin (LEVAQUIN) tablet 750 mg (has no administration in time range)  sodium chloride 0.9 % bolus 500 mL (0  mLs Intravenous Stopped 02/19/19 0638)  acetaminophen (TYLENOL) tablet 1,000 mg (1,000 mg Oral Given 02/19/19 0524)  potassium chloride SA (K-DUR,KLOR-CON) CR tablet 40 mEq (40 mEq Oral Given 02/19/19 0654)     Initial Impression / Assessment and Plan / ED Course  I have reviewed the triage vital signs and the nursing notes.  Pertinent labs & imaging results that were available during my care of the patient were reviewed by me and considered in my medical decision making (see chart for details).  Clinical Course as of Feb 19 712  Mon Feb 19, 2019  0520 febrile  Temp(!): 102.8 F (39.3 C) [HM]  0520 tachycardic  Pulse Rate(!): 106 [HM]  0520 Tachypnea   Resp(!): 27 [HM]  0520 NO hypoxia  SpO2: 96 % [HM]  0645 Has improved however patient continues to remain tachypneic.  He does have normal oxygen saturations.  Pulse Rate: 89 [HM]    Clinical Course User Index [HM] Usher Hedberg, Dossie, Swor was evaluated in Emergency Department on 02/19/2019 for the symptoms described in the history of present illness. He was evaluated in the context of the global COVID-19  pandemic, which necessitated consideration that the patient might be at risk for infection with the SARS-CoV-2 virus that causes COVID-19. Institutional protocols and algorithms that pertain to the evaluation of patients at risk for COVID-19 are in a state of rapid change based on information released by regulatory bodies including the CDC and federal and state organizations. These policies and algorithms were followed during the patient's care in the ED.  Presents with fever, cough, shortness of breath.  He has been sick for 8 days.  He reports his symptoms have been worsening over that time.  Do suspect COVID-19 at this time.  Chest x-ray now shows bilateral airspace disease which was not present on his chest x-ray on 02/14/2019.  Labs are reassuring.  Mild hypokalemia, repleted here in the emergency department.  His tachycardia has improved with his fever however he continues to be slightly tachypneic.  He does not have an additional oxygen requirement at this time.  7:03 AM  At this time, pt does not meet inpatient criteria, however I have concern that patient may worsen in the next several days.  RVP and COVID-19 test pending.  PNA covered with Levaquin.  Will d/c home with abx and strict return precautions for worsening symptoms.  Discussed this with patient.  He is comfortable with plan.  Questions answered.  He is stable for discharge home at this time.  The patient was discussed with and seen by Dr. Blinda Leatherwood who agrees with the treatment plan.    Final Clinical Impressions(s) / ED Diagnoses   Final diagnoses:  Suspected Covid-19 Virus Infection  Tachypnea  Hypokalemia    ED Discharge Orders         Ordered    potassium chloride SA (K-DUR,KLOR-CON) 20 MEQ tablet  Daily     02/19/19 0712    levofloxacin (LEVAQUIN) 750 MG tablet  Daily     02/19/19 0712           Maryclaire Stoecker, Dahlia Client, PA-C 02/19/19 1610    Gilda Crease, MD 02/19/19 [phone]

## 2019-02-19 NOTE — ED Notes (Signed)
Pt reports he was able to have his questions answered, verbalizes understanding of discharge instructions

## 2019-02-19 NOTE — Discharge Instructions (Addendum)
1. Medications: Levaquin, potassium, usual home medications 2. Treatment: rest, drink plenty of fluids, tylenol for fever 3. Follow Up: Please followup with your primary doctor in 2 days for discussion of your diagnoses and further evaluation after today's visit; if you do not have a primary care doctor use the resource guide provided to find one; Please return to the ER for worsening symptoms, difficulty breathing, fevers that are uncontrolled or any other concerns.     Person Under Monitoring Name: Joshua Lyons  Location: [address] [place] [address]   Infection Prevention Recommendations for Individuals Confirmed to have, or Being Evaluated for, 2019 Novel Coronavirus (COVID-19) Infection Who Receive Care at Home  Individuals who are confirmed to have, or are being evaluated for, COVID-19 should follow the prevention steps below until a healthcare provider or local or state health department says they can return to normal activities.  Stay home except to get medical care You should restrict activities outside your home, except for getting medical care. Do not go to work, school, or public areas, and do not use public transportation or taxis.  Call ahead before visiting your doctor Before your medical appointment, call the healthcare provider and tell them that you have, or are being evaluated for, COVID-19 infection. This will help the healthcare providers office take steps to keep other people from getting infected. Ask your healthcare provider to call the local or state health department.  Monitor your symptoms Seek prompt medical attention if your illness is worsening (e.g., difficulty breathing). Before going to your medical appointment, call the healthcare provider and tell them that you have, or are being evaluated for, COVID-19 infection. Ask your healthcare provider to call the local or state health department.  Wear a facemask You should wear a  facemask that covers your nose and mouth when you are in the same room with other people and when you visit a healthcare provider. People who live with or visit you should also wear a facemask while they are in the same room with you.  Separate yourself from other people in your home As much as possible, you should stay in a different room from other people in your home. Also, you should use a separate bathroom, if available.  Avoid sharing household items You should not share dishes, drinking glasses, cups, eating utensils, towels, bedding, or other items with other people in your home. After using these items, you should wash them thoroughly with soap and water.  Cover your coughs and sneezes Cover your mouth and nose with a tissue when you cough or sneeze, or you can cough or sneeze into your sleeve. Throw used tissues in a lined trash can, and immediately wash your hands with soap and water for at least 20 seconds or use an alcohol-based hand rub.  Wash your [employer] your hands often and thoroughly with soap and water for at least 20 seconds. You can use an alcohol-based hand sanitizer if soap and water are not available and if your hands are not visibly dirty. Avoid touching your eyes, nose, and mouth with unwashed hands.   Prevention Steps for Caregivers and Household Members of Individuals Confirmed to have, or Being Evaluated for, COVID-19 Infection Being Cared for in the Home  If you live with, or provide care at home for, a person confirmed to have, or being evaluated for, COVID-19 infection please follow these guidelines to prevent infection:  Follow healthcare providers instructions Make sure that you understand and  can help the patient follow any healthcare provider instructions for all care.  Provide for the patients basic needs You should help the patient with basic needs in the home and provide support for getting groceries, prescriptions, and other personal  needs.  Monitor the patients symptoms If they are getting sicker, call his or her medical provider and tell them that the patient has, or is being evaluated for, COVID-19 infection. This will help the healthcare providers office take steps to keep other people from getting infected. Ask the healthcare provider to call the local or state health department.  Limit the number of people who have contact with the patient If possible, have only one caregiver for the patient. Other household members should stay in another home or place of residence. If this is not possible, they should stay in another room, or be separated from the patient as much as possible. Use a separate bathroom, if available. Restrict visitors who do not have an essential need to be in the home.  Keep older adults, very young children, and other sick people away from the patient Keep older adults, very young children, and those who have compromised immune systems or chronic health conditions away from the patient. This includes people with chronic heart, lung, or kidney conditions, diabetes, and cancer.  Ensure good ventilation Make sure that shared spaces in the home have good air flow, such as from an air conditioner or an opened window, weather permitting.  Wash your hands often Wash your hands often and thoroughly with soap and water for at least 20 seconds. You can use an alcohol based hand sanitizer if soap and water are not available and if your hands are not visibly dirty. Avoid touching your eyes, nose, and mouth with unwashed hands. Use disposable paper towels to dry your hands. If not available, use dedicated cloth towels and replace them when they become wet.  Wear a facemask and gloves Wear a disposable facemask at all times in the room and gloves when you touch or have contact with the patients blood, body fluids, and/or secretions or excretions, such as sweat, saliva, sputum, nasal mucus, vomit, urine, or  feces.  Ensure the mask fits over your nose and mouth tightly, and do not touch it during use. Throw out disposable facemasks and gloves after using them. Do not reuse. Wash your hands immediately after removing your facemask and gloves. If your personal clothing becomes contaminated, carefully remove clothing and launder. Wash your hands after handling contaminated clothing. Place all used disposable facemasks, gloves, and other waste in a lined container before disposing them with other household waste. Remove gloves and wash your hands immediately after handling these items.  Do not share dishes, glasses, or other household items with the patient Avoid sharing household items. You should not share dishes, drinking glasses, cups, eating utensils, towels, bedding, or other items with a patient who is confirmed to have, or being evaluated for, COVID-19 infection. After the person uses these items, you should wash them thoroughly with soap and water.  Wash laundry thoroughly Immediately remove and wash clothes or bedding that have blood, body fluids, and/or secretions or excretions, such as sweat, saliva, sputum, nasal mucus, vomit, urine, or feces, on them. Wear gloves when handling laundry from the patient. Read and follow directions on labels of laundry or clothing items and detergent. In general, wash and dry with the warmest temperatures recommended on the label.  Clean all areas the individual has used often Clean all  touchable surfaces, such as counters, tabletops, doorknobs, bathroom fixtures, toilets, phones, keyboards, tablets, and bedside tables, every day. Also, clean any surfaces that may have blood, body fluids, and/or secretions or excretions on them. Wear gloves when cleaning surfaces the patient has come in contact with. Use a diluted bleach solution (e.g., dilute bleach with 1 part bleach and 10 parts water) or a household disinfectant with a label that says EPA-registered for  coronaviruses. To make a bleach solution at home, add 1 tablespoon of bleach to 1 quart (4 cups) of water. For a larger supply, add  cup of bleach to 1 gallon (16 cups) of water. Read labels of cleaning products and follow recommendations provided on product labels. Labels contain instructions for safe and effective use of the cleaning product including precautions you should take when applying the product, such as wearing gloves or eye protection and making sure you have good ventilation during use of the product. Remove gloves and wash hands immediately after cleaning.  Monitor yourself for signs and symptoms of illness Caregivers and household members are considered close contacts, should monitor their health, and will be asked to limit movement outside of the home to the extent possible. Follow the monitoring steps for close contacts listed on the symptom monitoring form.   ? If you have additional questions, contact your local health department or call the epidemiologist on call at [phone] (available 24/7). ? This guidance is subject to change. For the most up-to-date guidance from [facility], please refer to their website: [email or web address]

## 2019-02-19 NOTE — ED Triage Notes (Signed)
Pt has had a cough, fever, nausea, and some SOB for the past week, seen for the same last week, wife is sick as well

## 2019-02-24 LAB — CULTURE, BLOOD (ROUTINE X 2)
Culture: NO GROWTH
Culture: NO GROWTH
Special Requests: ADEQUATE

## 2019-03-02 ENCOUNTER — Telehealth: Payer: Self-pay | Admitting: *Deleted

## 2019-03-02 NOTE — Telephone Encounter (Signed)
Pt calling requesting test results for COVID testing.  [facility] consulted ED coordinator.

## 2019-04-04 ENCOUNTER — Telehealth: Payer: Self-pay | Admitting: *Deleted

## 2019-04-04 NOTE — Telephone Encounter (Signed)
Called pt to inquire about him donating plasma since he was positive for COVID-19.   I woke him up.   I apologized for waking him.    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

## 2019-12-05 IMAGING — CR CHEST - 2 VIEW
2 series · 2 of 2 positions shown · non-contrast
Comparison: None.

CLINICAL DATA: Cough with headache

EXAM:
CHEST - 2 VIEW

[chest pa]
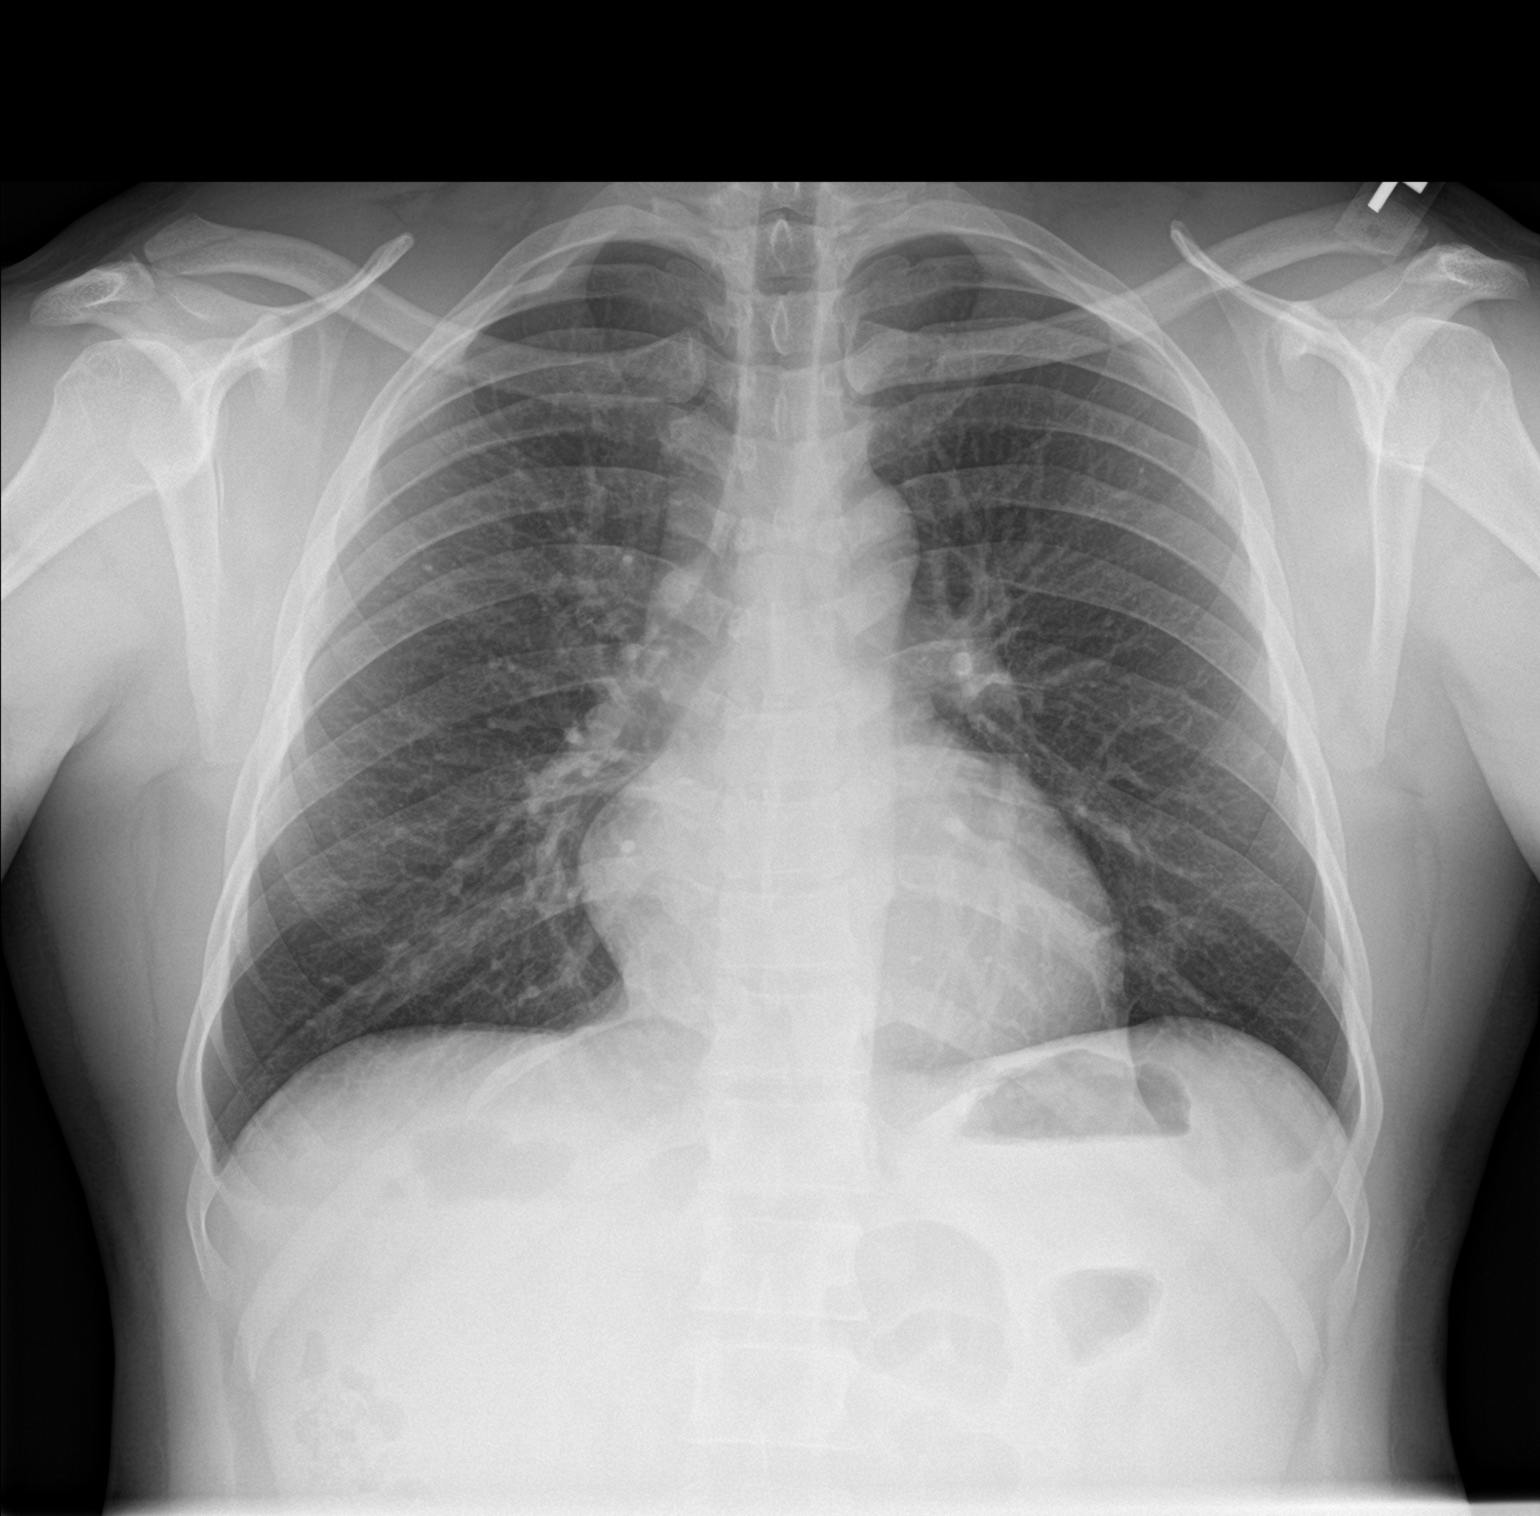

[chest lat]
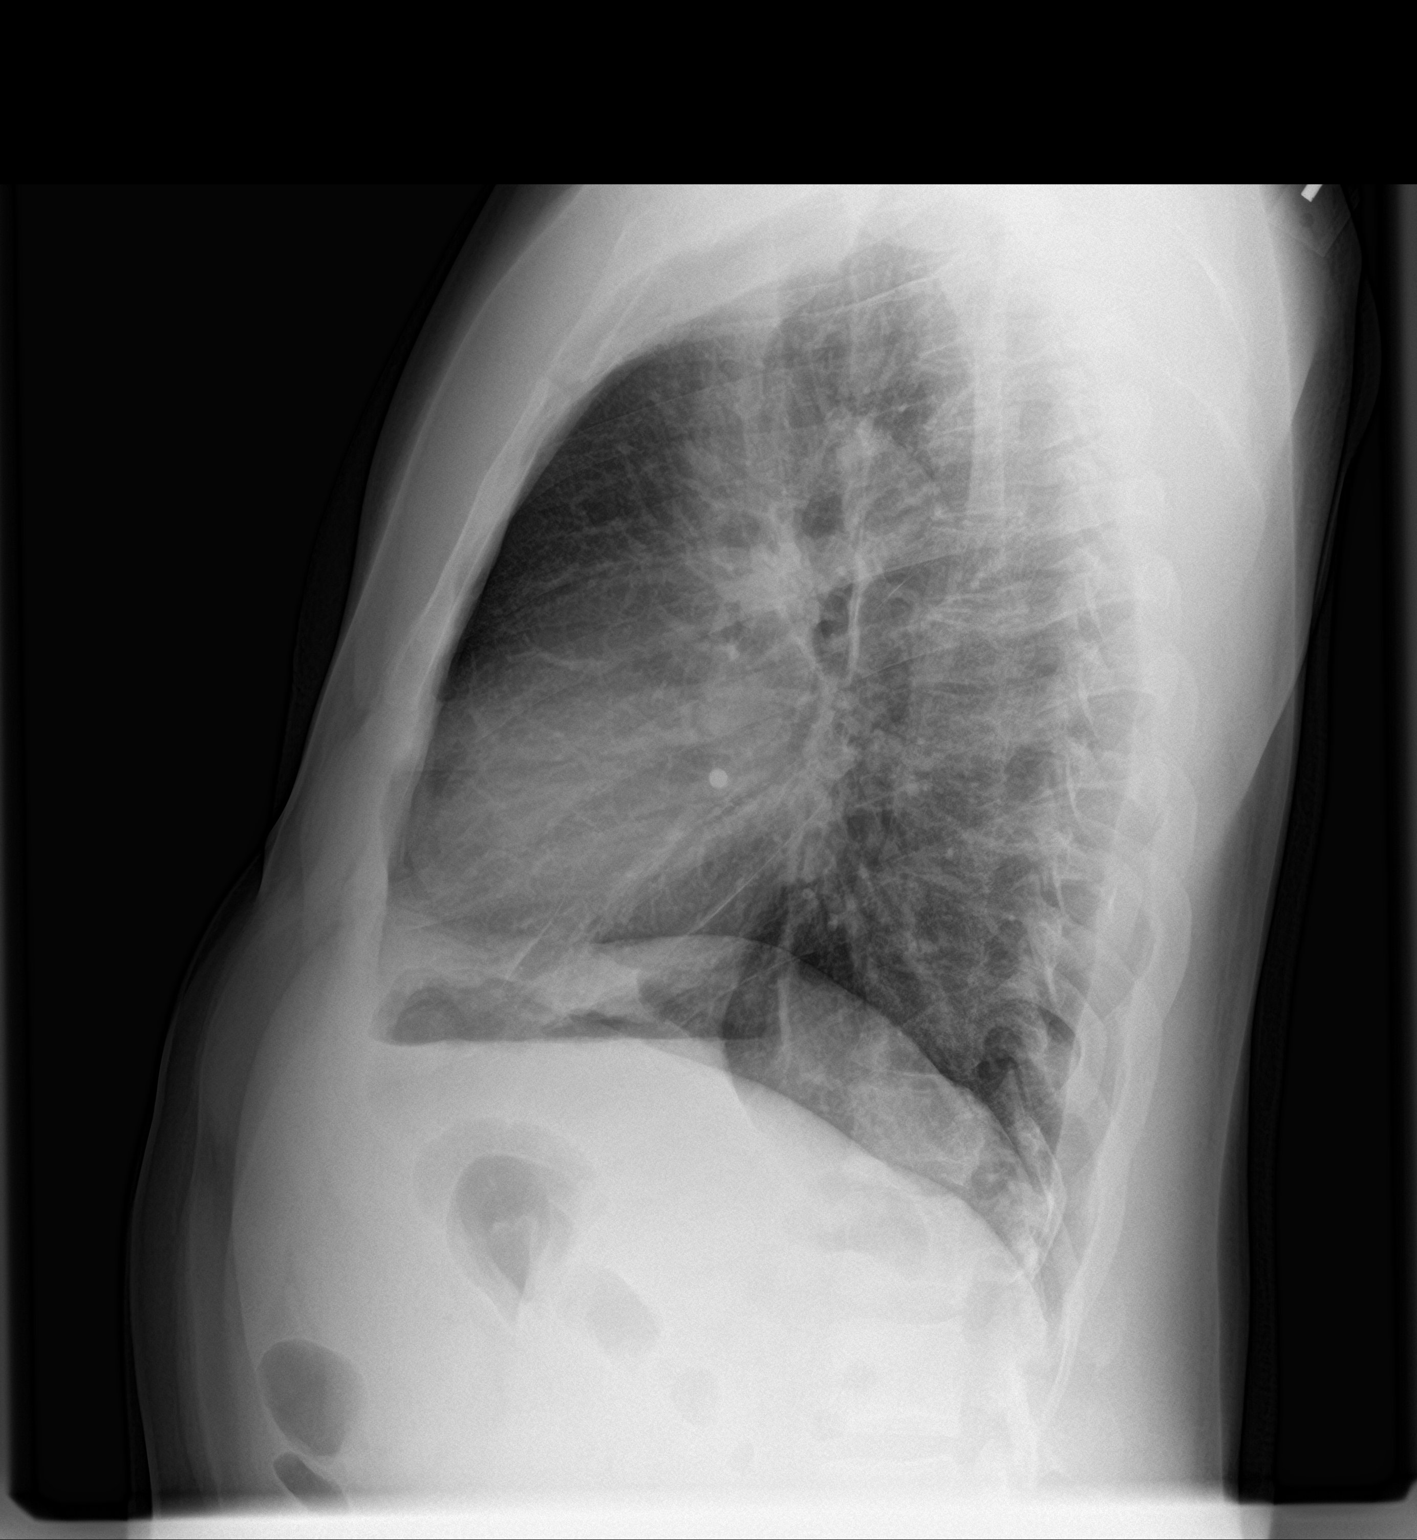

[2 of 2 positions shown; findings below may reference images not displayed]

FINDINGS: Lungs are clear. Heart size and pulmonary vascularity are normal. No
adenopathy. No bone lesions.
IMPRESSION: No edema or consolidation.
